# Patient Record
Sex: Female | Born: 1955
Health system: Southern US, Community
[De-identification: ages and names within clinical notes are randomized; demographics above are authoritative.]

## PROBLEM LIST (undated history)

## (undated) DIAGNOSIS — E7800 Pure hypercholesterolemia, unspecified: Secondary | ICD-10-CM

## (undated) HISTORY — DX: Pure hypercholesterolemia, unspecified: E78.00

## (undated) HISTORY — PX: CARPAL TUNNEL RELEASE: SHX101

## (undated) HISTORY — PX: BREAST BIOPSY: SHX20

---

## 2000-05-20 ENCOUNTER — Encounter: Payer: Self-pay | Admitting: *Deleted

## 2000-05-20 ENCOUNTER — Ambulatory Visit (HOSPITAL_COMMUNITY): Admission: RE | Admit: 2000-05-20 | Discharge: 2000-05-20 | Payer: Self-pay | Admitting: *Deleted

## 2002-07-20 ENCOUNTER — Ambulatory Visit (HOSPITAL_COMMUNITY): Admission: RE | Admit: 2002-07-20 | Discharge: 2002-07-20 | Payer: Self-pay | Admitting: *Deleted

## 2002-07-20 ENCOUNTER — Encounter: Payer: Self-pay | Admitting: *Deleted

## 2002-07-23 ENCOUNTER — Encounter: Admission: RE | Admit: 2002-07-23 | Discharge: 2002-07-23 | Payer: Self-pay | Admitting: Family Medicine

## 2002-07-23 ENCOUNTER — Encounter: Payer: Self-pay | Admitting: Family Medicine

## 2002-07-23 ENCOUNTER — Other Ambulatory Visit: Admission: RE | Admit: 2002-07-23 | Discharge: 2002-07-23 | Payer: Self-pay | Admitting: *Deleted

## 2003-08-13 ENCOUNTER — Other Ambulatory Visit: Admission: RE | Admit: 2003-08-13 | Discharge: 2003-08-13 | Payer: Self-pay | Admitting: *Deleted

## 2004-02-28 ENCOUNTER — Ambulatory Visit (HOSPITAL_COMMUNITY): Admission: RE | Admit: 2004-02-28 | Discharge: 2004-02-28 | Payer: Self-pay | Admitting: Gastroenterology

## 2005-11-15 ENCOUNTER — Ambulatory Visit (HOSPITAL_COMMUNITY): Admission: RE | Admit: 2005-11-15 | Discharge: 2005-11-15 | Payer: Self-pay | Admitting: *Deleted

## 2005-11-23 ENCOUNTER — Other Ambulatory Visit: Admission: RE | Admit: 2005-11-23 | Discharge: 2005-11-23 | Payer: Self-pay | Admitting: *Deleted

## 2007-06-09 ENCOUNTER — Ambulatory Visit (HOSPITAL_COMMUNITY): Admission: RE | Admit: 2007-06-09 | Discharge: 2007-06-09 | Payer: Self-pay | Admitting: *Deleted

## 2007-07-05 ENCOUNTER — Other Ambulatory Visit: Admission: RE | Admit: 2007-07-05 | Discharge: 2007-07-05 | Payer: Self-pay | Admitting: *Deleted

## 2008-11-08 ENCOUNTER — Ambulatory Visit: Payer: Self-pay | Admitting: Obstetrics and Gynecology

## 2008-11-18 ENCOUNTER — Ambulatory Visit: Payer: Self-pay | Admitting: Obstetrics and Gynecology

## 2008-11-18 ENCOUNTER — Encounter: Payer: Self-pay | Admitting: Obstetrics and Gynecology

## 2008-11-18 ENCOUNTER — Other Ambulatory Visit: Admission: RE | Admit: 2008-11-18 | Discharge: 2008-11-18 | Payer: Self-pay | Admitting: Obstetrics and Gynecology

## 2008-11-29 ENCOUNTER — Ambulatory Visit: Payer: Self-pay | Admitting: Obstetrics and Gynecology

## 2008-12-16 ENCOUNTER — Ambulatory Visit: Payer: Self-pay | Admitting: Obstetrics and Gynecology

## 2008-12-18 ENCOUNTER — Encounter: Payer: Self-pay | Admitting: Obstetrics and Gynecology

## 2008-12-18 ENCOUNTER — Ambulatory Visit: Payer: Self-pay | Admitting: Obstetrics and Gynecology

## 2008-12-18 ENCOUNTER — Ambulatory Visit (HOSPITAL_BASED_OUTPATIENT_CLINIC_OR_DEPARTMENT_OTHER): Admission: RE | Admit: 2008-12-18 | Discharge: 2008-12-18 | Payer: Self-pay | Admitting: Obstetrics and Gynecology

## 2009-01-01 ENCOUNTER — Ambulatory Visit: Payer: Self-pay | Admitting: Obstetrics and Gynecology

## 2009-09-15 ENCOUNTER — Ambulatory Visit (HOSPITAL_COMMUNITY): Admission: RE | Admit: 2009-09-15 | Discharge: 2009-09-15 | Payer: Self-pay | Admitting: Family Medicine

## 2010-09-15 NOTE — Op Note (Signed)
Carol Christensen, Carol Christensen                ACCOUNT NO.:  [id number]   MEDICAL RECORD NO.:  [id number]          PATIENT TYPE:  AMB   LOCATION:  [facility]                         FACILITY:  [facility]   PHYSICIAN:  Daniel L. Gottsegen, M.D.DATE OF BIRTH:  [birth year]   DATE OF PROCEDURE:  12/18/2008  DATE OF DISCHARGE:                               OPERATIVE REPORT   PREOPERATIVE DIAGNOSIS:  1. Menorrhagia.  2. Endometrial polyp.  3. Leiomyomata uteri.   POSTOPERATIVE DIAGNOSIS:  1. Menorrhagia.  2. Endometrial polyp.  3. Leiomyomata uteri.   OPERATIONS:  Hysteroscopy with excision of endometrial polyp, dilatation  and curettage.   SURGEON:  Dr. Edyth Gunnels   ANESTHESIA:  General.   INDICATIONS:  The patient is a 55 year old gravida 4, para 3, AB 1, who  has continued to have regular periods until recently when she had severe  menometrorrhagia.  It was stopped with Megace.  An endometrial biopsy  was benign.  Saline infusion hysterogram showed multiple myomas, as well  as a large endometrial polyp.  She now enters the hospital for D and C,  hysteroscopy.   FINDINGS:  External is normal.  BUS is normal.  Vaginal is normal.  Cervix is clean.  Uterus is enlarged by myomas, including one coming off  the left adnexal area, for a total size of the fundus of about 10+  weeks.  Adnexa failed to reveal masses, other than the myoma that can be  found on the left side.  At the time of hysteroscopy, the patient had a  very large endometrial polyp off the posterior wall of about 2 cm.  The  intrauterine cavity was enlarged, probably from her myomas, although we  did not sound her, because we did not want to risk perforation.  I  estimated that the cavity length was 10-11 cm.   PROCEDURE:  After adequate general endotracheal anesthesia, the patient  was placed in dorsal lithotomy position, prepped and draped in the usual  sterile manner.  A single-tooth tenaculum was placed on the anterior lip  of  the cervix.  The cervix could be dilated to a #31 Pratt dilator,  although she had a very tight cervix.  A hysteroscopic resectoscope was  then introduced; 3% sorbitol was used to expand the intrauterine cavity.  A camera was used for magnification.  The large polyp was immediately  encountered.  A picture was taken of it and then it was excised with a  90-degree wire loop with appropriate Bovie settings.  An endometrial  curettage was done with a large amount of tissue removed, due to the  Megace she was on.  She was then re-hysteroscoped and she had a very  normal  endometrial cavity at this point.  Therefore, the procedure was  terminated.  Fluid deficit was 60 mL.  She had sustained a very small  laceration on the anterior lip of the cervix from the dilatation and  this was repaired with a figure-of-eight of 2-0 Vicryl.  Blood loss was  minimal.  The patient left the operating room in satisfactory condition.  Daniel L. Eda Paschal, M.D.  Electronically Signed     DLG/MEDQ  D:  12/18/2008  T:  12/18/2008  Job:  [id number]

## 2010-09-18 NOTE — Op Note (Signed)
Carol Christensen, Carol Christensen                ACCOUNT NO.:  [id number]   MEDICAL RECORD NO.:  [id number]          PATIENT TYPE:  AMB   LOCATION:  ENDO                         FACILITY:  [facility]   PHYSICIAN:  James L. Malon Kindle., M.D.DATE OF BIRTH:  06-Feb-1956   DATE OF PROCEDURE:  DATE OF DISCHARGE:                                 OPERATIVE REPORT   PROCEDURE:  Colonoscopy.   SURGEON:   MEDICATIONS:  Fentanyl 80 mcg and Versed 8 mg IV.   SCOPE:  Olympus pediatric colonoscope.   INDICATIONS FOR PROCEDURE:  Colon cancer screening.   DESCRIPTION OF PROCEDURE:  The procedure had been explained to the patient  and consent obtained.  With the patient in the left lateral decubitus  position, the Olympus pediatric adjustable scope was inserted and advanced.  The prep was excellent.  We were able to reach the cecum with minimal  difficulty.  The ileocecal valve and appendiceal orifice were seen.  The  scope was withdrawn.  The cecum, ascending colon, transverse colon,  descending and sigmoid colon were seen well and no polyps were seen.  There  were scattered diverticula in the sigmoid colon.  The rectum was free of  polyps.  The scope was withdrawn.  The patient tolerated the procedure well.   ASSESSMENT:  Normal screening colonoscopy, V76.51.   PLAN:  Would recommend yearly Hemoccults and repeat colonoscopy in the  future for specific indications.       JLE/MEDQ  D:  02/28/2004  T:  02/28/2004  Job:  [id number]   cc:   Bryan Lemma. Manus Gunning, M.D.  [address] [place]  [place] [address]  Fax: [phone]

## 2012-03-21 ENCOUNTER — Other Ambulatory Visit (HOSPITAL_COMMUNITY): Payer: Self-pay | Admitting: Family Medicine

## 2012-03-21 DIAGNOSIS — Z1231 Encounter for screening mammogram for malignant neoplasm of breast: Secondary | ICD-10-CM

## 2012-04-11 ENCOUNTER — Ambulatory Visit (HOSPITAL_COMMUNITY)
Admission: RE | Admit: 2012-04-11 | Discharge: 2012-04-11 | Disposition: A | Discharge status: Home / Self Care | Payer: 59 | Source: Ambulatory Visit | Attending: Family Medicine | Admitting: Family Medicine

## 2012-04-11 DIAGNOSIS — Z1231 Encounter for screening mammogram for malignant neoplasm of breast: Secondary | ICD-10-CM | POA: Insufficient documentation

## 2012-04-14 ENCOUNTER — Other Ambulatory Visit: Payer: Self-pay | Admitting: Family Medicine

## 2012-04-14 DIAGNOSIS — R928 Other abnormal and inconclusive findings on diagnostic imaging of breast: Secondary | ICD-10-CM

## 2012-04-17 ENCOUNTER — Other Ambulatory Visit: Payer: Self-pay | Admitting: Family Medicine

## 2012-04-17 ENCOUNTER — Ambulatory Visit
Admission: RE | Admit: 2012-04-17 | Discharge: 2012-04-17 | Disposition: A | Discharge status: Home / Self Care | Payer: 59 | Source: Ambulatory Visit | Attending: Family Medicine | Admitting: Family Medicine

## 2012-04-17 DIAGNOSIS — R928 Other abnormal and inconclusive findings on diagnostic imaging of breast: Secondary | ICD-10-CM

## 2012-05-05 ENCOUNTER — Other Ambulatory Visit: Payer: Self-pay | Admitting: Family Medicine

## 2012-05-05 ENCOUNTER — Ambulatory Visit
Admission: RE | Admit: 2012-05-05 | Discharge: 2012-05-05 | Disposition: A | Discharge status: Home / Self Care | Payer: 59 | Source: Ambulatory Visit | Attending: Family Medicine | Admitting: Family Medicine

## 2012-05-05 ENCOUNTER — Ambulatory Visit
Admission: RE | Admit: 2012-05-05 | Discharge: 2012-05-05 | Disposition: A | Discharge status: Home / Self Care | Payer: 59 | Source: Ambulatory Visit

## 2012-05-05 DIAGNOSIS — R928 Other abnormal and inconclusive findings on diagnostic imaging of breast: Secondary | ICD-10-CM

## 2012-05-05 DIAGNOSIS — N63 Unspecified lump in unspecified breast: Secondary | ICD-10-CM

## 2012-05-10 ENCOUNTER — Other Ambulatory Visit: Payer: Self-pay | Admitting: Family Medicine

## 2012-05-10 DIAGNOSIS — R928 Other abnormal and inconclusive findings on diagnostic imaging of breast: Secondary | ICD-10-CM

## 2013-04-13 ENCOUNTER — Other Ambulatory Visit: Payer: Self-pay | Admitting: Family Medicine

## 2013-04-13 DIAGNOSIS — N6082 Other benign mammary dysplasias of left breast: Secondary | ICD-10-CM

## 2013-04-19 ENCOUNTER — Other Ambulatory Visit (HOSPITAL_COMMUNITY)
Admission: RE | Admit: 2013-04-19 | Discharge: 2013-04-19 | Disposition: A | Discharge status: Home / Self Care | Payer: 59 | Source: Ambulatory Visit | Attending: Family Medicine | Admitting: Family Medicine

## 2013-04-19 ENCOUNTER — Other Ambulatory Visit: Payer: Self-pay | Admitting: Family Medicine

## 2013-04-19 DIAGNOSIS — Z124 Encounter for screening for malignant neoplasm of cervix: Secondary | ICD-10-CM | POA: Insufficient documentation

## 2013-05-04 ENCOUNTER — Ambulatory Visit
Admission: RE | Admit: 2013-05-04 | Discharge: 2013-05-04 | Disposition: A | Discharge status: Home / Self Care | Payer: 59 | Source: Ambulatory Visit | Attending: Family Medicine | Admitting: Family Medicine

## 2013-05-04 DIAGNOSIS — N6082 Other benign mammary dysplasias of left breast: Secondary | ICD-10-CM

## 2014-04-09 ENCOUNTER — Other Ambulatory Visit: Payer: Self-pay

## 2014-04-09 DIAGNOSIS — Z1231 Encounter for screening mammogram for malignant neoplasm of breast: Secondary | ICD-10-CM

## 2014-05-06 ENCOUNTER — Ambulatory Visit
Admission: RE | Admit: 2014-05-06 | Discharge: 2014-05-06 | Disposition: A | Discharge status: Home / Self Care | Payer: 59 | Source: Ambulatory Visit

## 2014-05-06 DIAGNOSIS — Z1231 Encounter for screening mammogram for malignant neoplasm of breast: Secondary | ICD-10-CM

## 2014-05-08 ENCOUNTER — Other Ambulatory Visit: Payer: Self-pay | Admitting: Family Medicine

## 2014-05-08 DIAGNOSIS — R928 Other abnormal and inconclusive findings on diagnostic imaging of breast: Secondary | ICD-10-CM

## 2014-05-21 ENCOUNTER — Other Ambulatory Visit: Payer: Self-pay | Admitting: Family Medicine

## 2014-05-23 ENCOUNTER — Ambulatory Visit
Admission: RE | Admit: 2014-05-23 | Discharge: 2014-05-23 | Disposition: A | Discharge status: Home / Self Care | Payer: 59 | Source: Ambulatory Visit | Attending: Family Medicine | Admitting: Family Medicine

## 2014-05-23 ENCOUNTER — Other Ambulatory Visit: Payer: Self-pay | Admitting: Family Medicine

## 2014-05-23 DIAGNOSIS — R921 Mammographic calcification found on diagnostic imaging of breast: Secondary | ICD-10-CM

## 2014-05-23 DIAGNOSIS — R928 Other abnormal and inconclusive findings on diagnostic imaging of breast: Secondary | ICD-10-CM

## 2014-05-28 ENCOUNTER — Other Ambulatory Visit: Payer: Self-pay | Admitting: Family Medicine

## 2014-05-28 DIAGNOSIS — R921 Mammographic calcification found on diagnostic imaging of breast: Secondary | ICD-10-CM

## 2014-06-10 ENCOUNTER — Ambulatory Visit
Admission: RE | Admit: 2014-06-10 | Discharge: 2014-06-10 | Disposition: A | Discharge status: Home / Self Care | Payer: 59 | Source: Ambulatory Visit | Attending: Family Medicine | Admitting: Family Medicine

## 2014-06-10 DIAGNOSIS — R921 Mammographic calcification found on diagnostic imaging of breast: Secondary | ICD-10-CM

## 2016-06-09 ENCOUNTER — Other Ambulatory Visit: Payer: Self-pay | Admitting: Family Medicine

## 2016-06-09 ENCOUNTER — Other Ambulatory Visit (HOSPITAL_COMMUNITY)
Admission: RE | Admit: 2016-06-09 | Discharge: 2016-06-09 | Disposition: A | Discharge status: Home / Self Care | Payer: 59 | Source: Ambulatory Visit | Attending: Family Medicine | Admitting: Family Medicine

## 2016-06-09 DIAGNOSIS — Z01419 Encounter for gynecological examination (general) (routine) without abnormal findings: Secondary | ICD-10-CM | POA: Insufficient documentation

## 2016-06-11 LAB — CYTOLOGY - PAP: Diagnosis: NEGATIVE

## 2016-10-15 IMAGING — MG MM SCREENING BREAST TOMO BILATERAL
6 of 10 series · 6 of 26 positions shown · non-contrast
Comparison: Previous Exam(s)

CLINICAL DATA: Screening.

EXAM:
DIGITAL SCREENING BILATERAL MAMMOGRAM WITH 3D TOMO WITH CAD

[L MLO (1 of 2)]
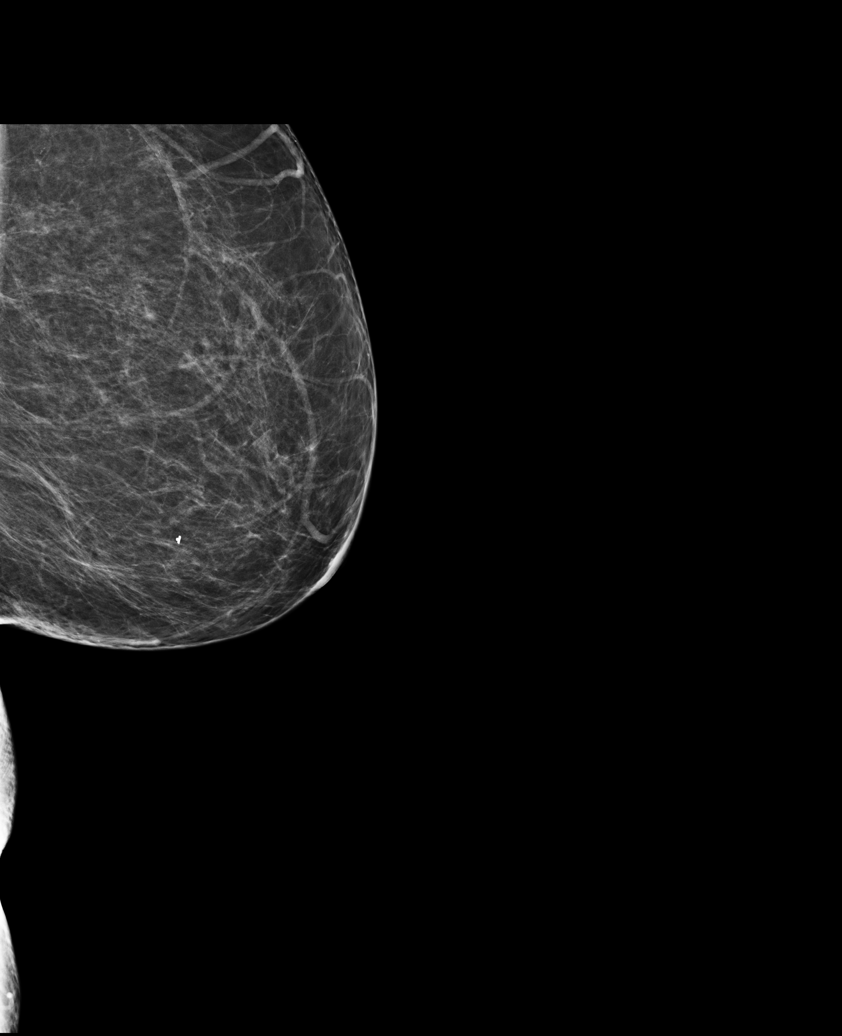

[R MLO (1 of 2)]
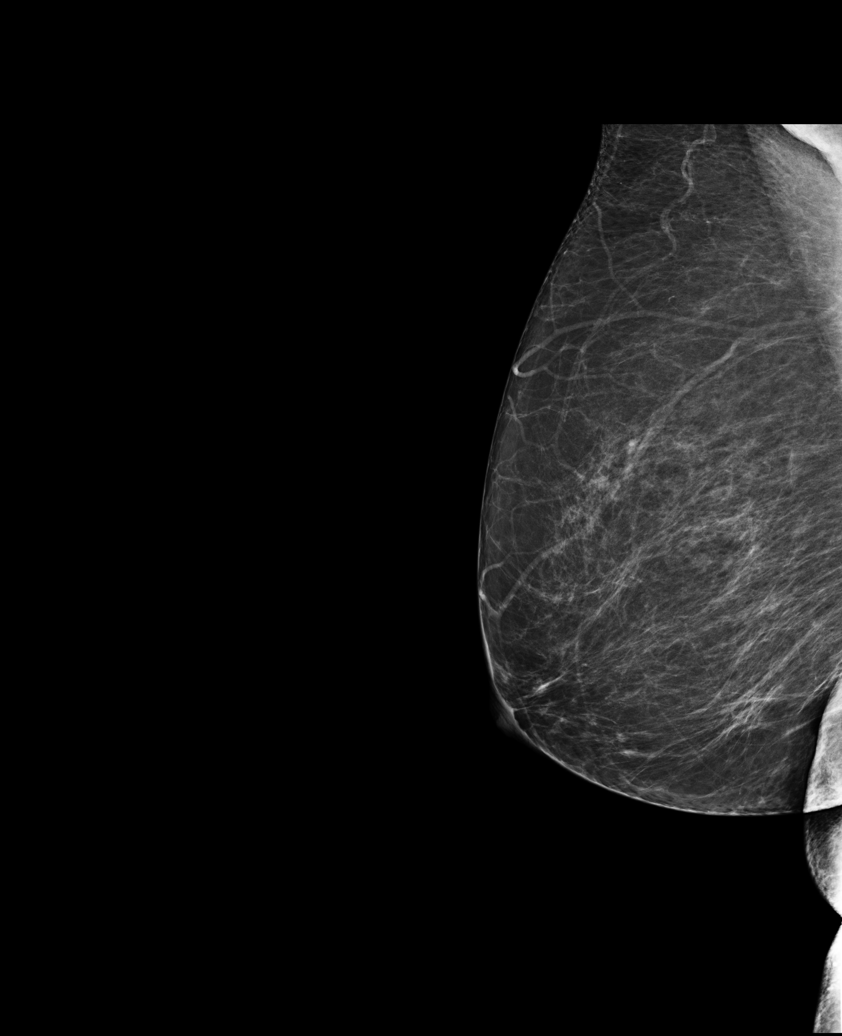

[R CC]
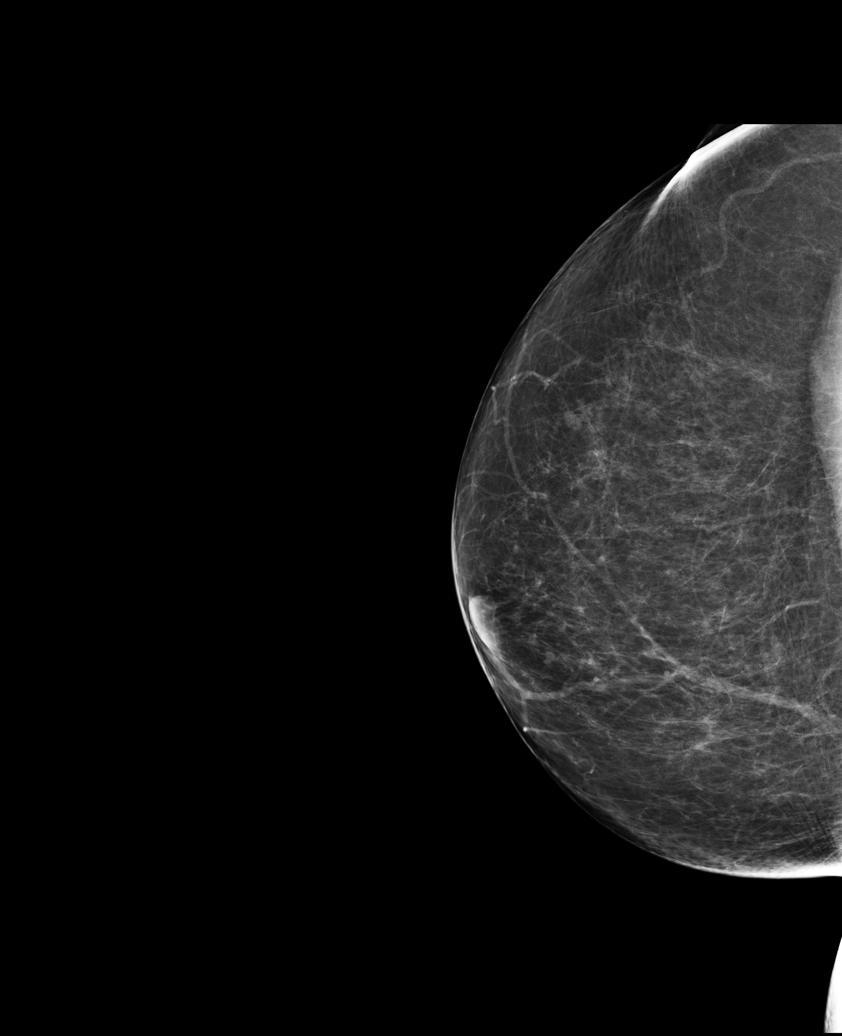

[L CC]
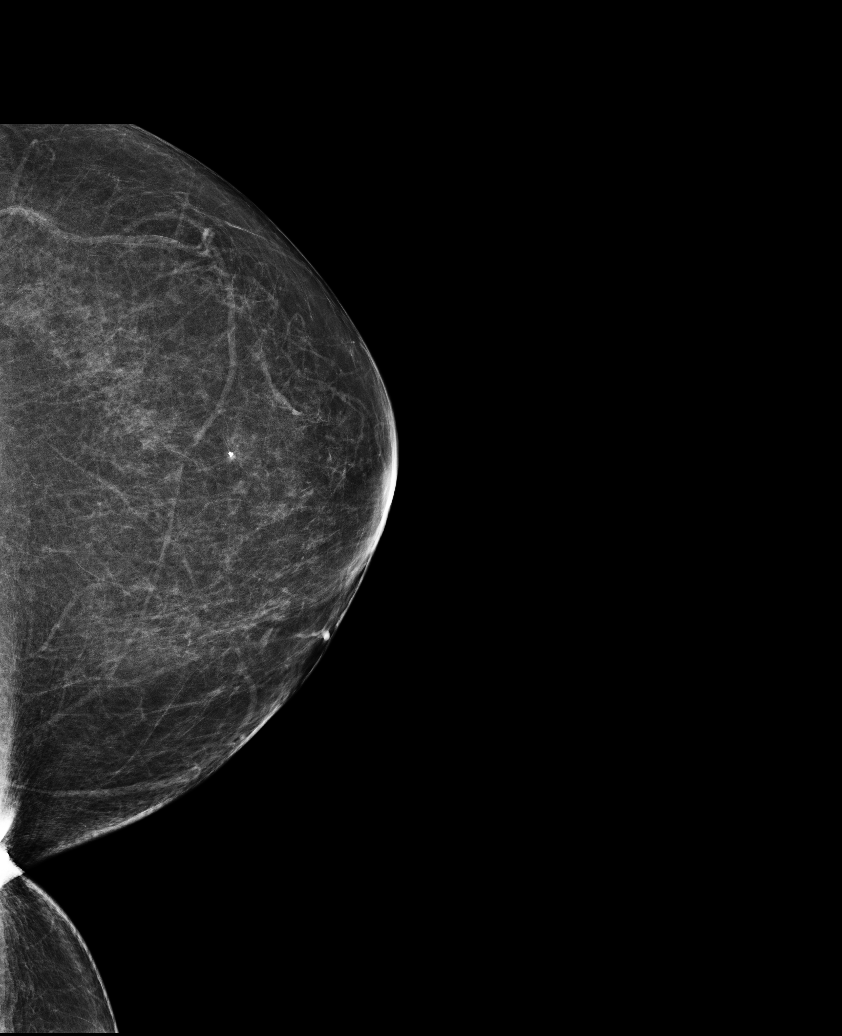

[L MLO (2 of 2)]
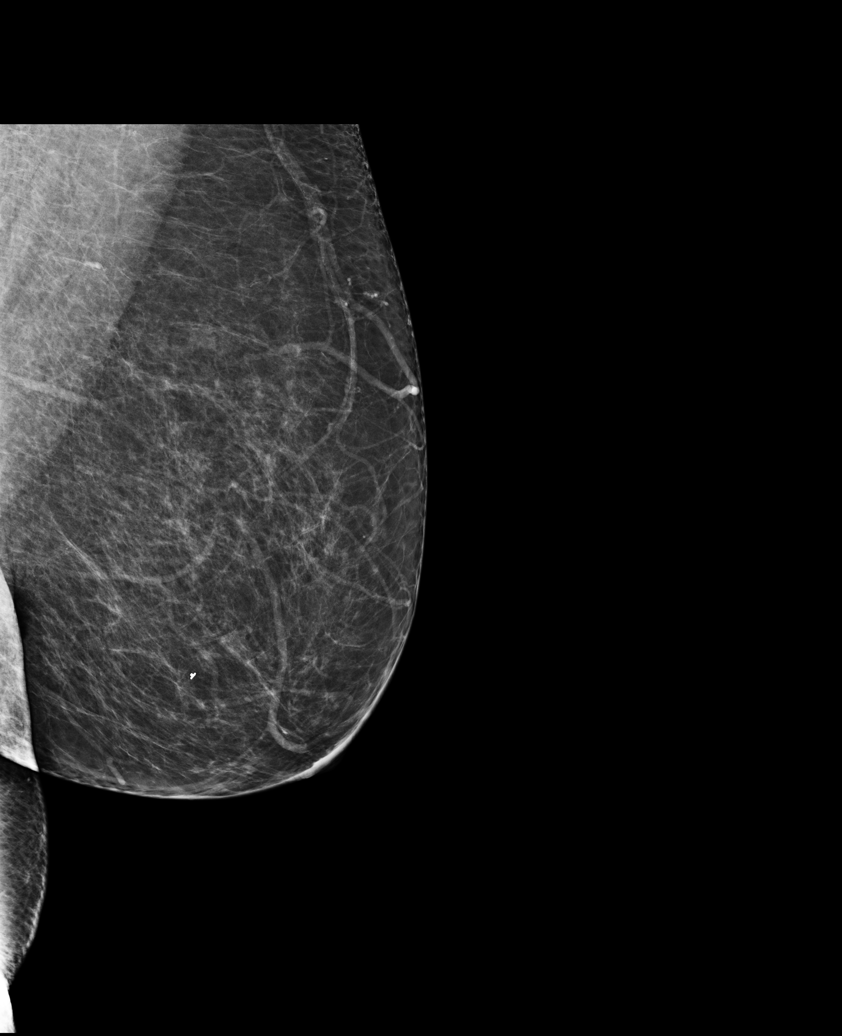

[R MLO (2 of 2)]
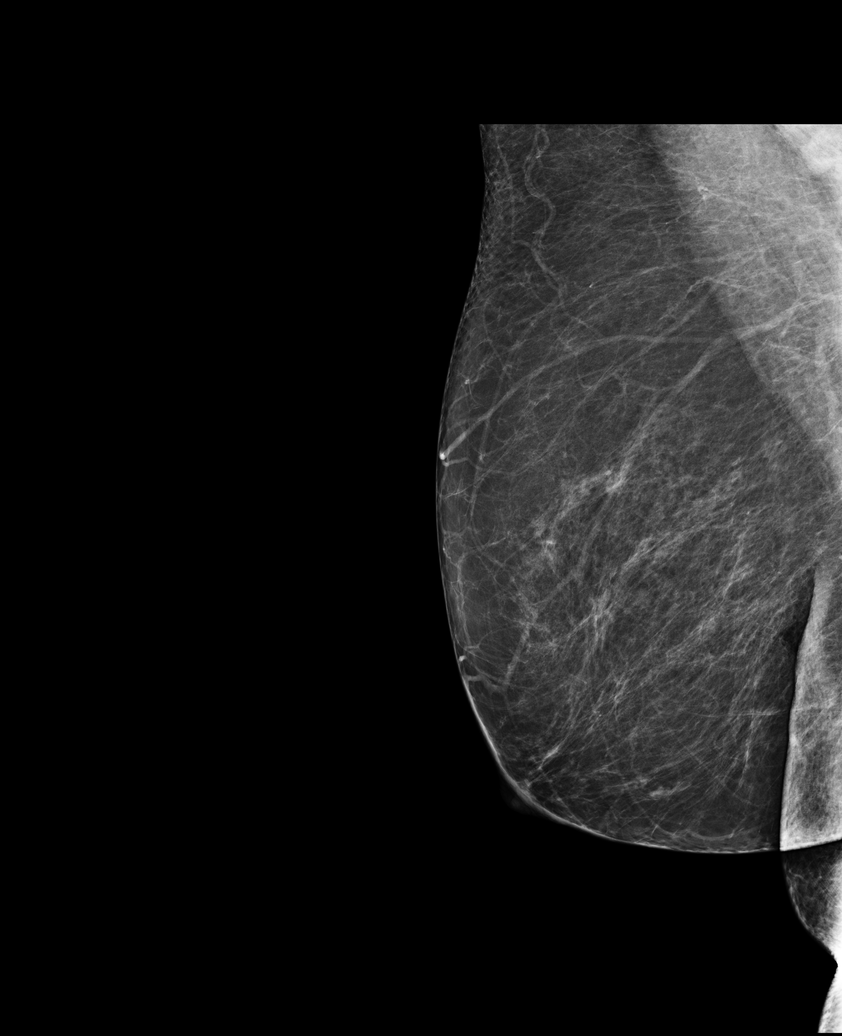

[6 of 26 positions shown; findings below may reference images not displayed]

ACR Breast Density Category b: There are scattered areas of
fibroglandular density.
FINDINGS: In the left breast, calcifications warrant further evaluation with
magnified views. In the right breast, no findings suspicious for
malignancy. Images were processed with CAD.
IMPRESSION: Further evaluation is suggested for calcifications in the left
breast.

RECOMMENDATION:
Diagnostic mammogram of the left breast. (Code:4F-5-LLD)

The patient will be contacted regarding the findings, and additional
imaging will be scheduled.

BI-RADS CATEGORY  0: Incomplete. Need additional imaging evaluation
and/or prior mammograms for comparison.

## 2016-10-28 ENCOUNTER — Other Ambulatory Visit: Payer: Self-pay | Admitting: Family Medicine

## 2016-10-28 DIAGNOSIS — Z1231 Encounter for screening mammogram for malignant neoplasm of breast: Secondary | ICD-10-CM

## 2016-11-02 ENCOUNTER — Ambulatory Visit
Admission: RE | Admit: 2016-11-02 | Discharge: 2016-11-02 | Disposition: A | Discharge status: Home / Self Care | Payer: 59 | Source: Ambulatory Visit | Attending: Family Medicine | Admitting: Family Medicine

## 2016-11-02 DIAGNOSIS — Z1231 Encounter for screening mammogram for malignant neoplasm of breast: Secondary | ICD-10-CM

## 2018-06-05 DIAGNOSIS — Z13220 Encounter for screening for lipoid disorders: Secondary | ICD-10-CM | POA: Diagnosis not present

## 2018-06-05 DIAGNOSIS — Z0000 Encounter for general adult medical examination without abnormal findings: Secondary | ICD-10-CM | POA: Diagnosis not present

## 2018-06-05 DIAGNOSIS — G250 Essential tremor: Secondary | ICD-10-CM | POA: Diagnosis not present

## 2018-09-04 DIAGNOSIS — E7800 Pure hypercholesterolemia, unspecified: Secondary | ICD-10-CM | POA: Diagnosis not present

## 2018-11-02 DIAGNOSIS — E7800 Pure hypercholesterolemia, unspecified: Secondary | ICD-10-CM | POA: Diagnosis not present

## 2018-11-02 DIAGNOSIS — Z79899 Other long term (current) drug therapy: Secondary | ICD-10-CM | POA: Diagnosis not present

## 2018-11-22 ENCOUNTER — Other Ambulatory Visit: Payer: Self-pay | Admitting: Family Medicine

## 2018-11-22 DIAGNOSIS — Z1231 Encounter for screening mammogram for malignant neoplasm of breast: Secondary | ICD-10-CM

## 2018-11-30 ENCOUNTER — Ambulatory Visit
Admission: RE | Admit: 2018-11-30 | Discharge: 2018-11-30 | Disposition: A | Discharge status: Home / Self Care | Payer: Medicare Other | Source: Ambulatory Visit | Attending: Family Medicine | Admitting: Family Medicine

## 2018-11-30 ENCOUNTER — Other Ambulatory Visit: Payer: Self-pay

## 2018-11-30 DIAGNOSIS — Z1231 Encounter for screening mammogram for malignant neoplasm of breast: Secondary | ICD-10-CM

## 2018-12-14 DIAGNOSIS — H2513 Age-related nuclear cataract, bilateral: Secondary | ICD-10-CM | POA: Diagnosis not present

## 2018-12-14 DIAGNOSIS — H04123 Dry eye syndrome of bilateral lacrimal glands: Secondary | ICD-10-CM | POA: Diagnosis not present

## 2018-12-14 DIAGNOSIS — H5203 Hypermetropia, bilateral: Secondary | ICD-10-CM | POA: Diagnosis not present

## 2018-12-14 DIAGNOSIS — H43811 Vitreous degeneration, right eye: Secondary | ICD-10-CM | POA: Diagnosis not present

## 2019-02-01 DIAGNOSIS — M25532 Pain in left wrist: Secondary | ICD-10-CM | POA: Diagnosis not present

## 2019-02-01 DIAGNOSIS — M654 Radial styloid tenosynovitis [de Quervain]: Secondary | ICD-10-CM | POA: Diagnosis not present

## 2019-03-08 DIAGNOSIS — M654 Radial styloid tenosynovitis [de Quervain]: Secondary | ICD-10-CM | POA: Diagnosis not present

## 2019-03-08 DIAGNOSIS — M25532 Pain in left wrist: Secondary | ICD-10-CM | POA: Diagnosis not present

## 2019-05-08 DIAGNOSIS — M654 Radial styloid tenosynovitis [de Quervain]: Secondary | ICD-10-CM | POA: Diagnosis not present

## 2019-05-08 DIAGNOSIS — M25532 Pain in left wrist: Secondary | ICD-10-CM | POA: Diagnosis not present

## 2019-05-18 ENCOUNTER — Other Ambulatory Visit: Payer: Self-pay

## 2019-05-18 DIAGNOSIS — E65 Localized adiposity: Secondary | ICD-10-CM | POA: Diagnosis not present

## 2019-05-18 DIAGNOSIS — M654 Radial styloid tenosynovitis [de Quervain]: Secondary | ICD-10-CM | POA: Diagnosis not present

## 2019-05-18 DIAGNOSIS — D2112 Benign neoplasm of connective and other soft tissue of left upper limb, including shoulder: Secondary | ICD-10-CM | POA: Diagnosis not present

## 2019-06-03 ENCOUNTER — Ambulatory Visit: Payer: Medicare Other

## 2019-06-07 DIAGNOSIS — Z0000 Encounter for general adult medical examination without abnormal findings: Secondary | ICD-10-CM | POA: Diagnosis not present

## 2019-06-07 DIAGNOSIS — Z1159 Encounter for screening for other viral diseases: Secondary | ICD-10-CM | POA: Diagnosis not present

## 2019-06-07 DIAGNOSIS — Z79899 Other long term (current) drug therapy: Secondary | ICD-10-CM | POA: Diagnosis not present

## 2019-06-07 DIAGNOSIS — E7800 Pure hypercholesterolemia, unspecified: Secondary | ICD-10-CM | POA: Diagnosis not present

## 2019-06-10 ENCOUNTER — Ambulatory Visit: Payer: Medicare Other

## 2019-06-24 ENCOUNTER — Ambulatory Visit: Payer: Medicare Other

## 2019-07-04 ENCOUNTER — Ambulatory Visit: Payer: Medicare Other | Attending: Internal Medicine

## 2019-07-04 DIAGNOSIS — Z23 Encounter for immunization: Secondary | ICD-10-CM | POA: Insufficient documentation

## 2019-07-04 NOTE — Progress Notes (Signed)
   Covid-19 Vaccination Clinic  Name:  AERILYNN GOIN    MRN: [id number] DOB: [birth year]  07/04/2019  Ms. Hillery was observed post Covid-19 immunization for 15 minutes without incident. She was provided with Vaccine Information Sheet and instruction to access the V-Safe system.   Ms. Fennell was instructed to call 911 with any severe reactions post vaccine: Marland Kitchen Difficulty breathing  . Swelling of face and throat  . A fast heartbeat  . A bad rash all over body  . Dizziness and weakness   Immunizations Administered    Name Date Dose VIS Date Route   Pfizer COVID-19 Vaccine 07/04/2019  9:04 AM 0.3 mL 04/13/2019 Intramuscular   Manufacturer: [employer], [employer]   Lot: [id number]   NDC: [address]-8

## 2019-07-08 DIAGNOSIS — L509 Urticaria, unspecified: Secondary | ICD-10-CM | POA: Diagnosis not present

## 2019-07-25 ENCOUNTER — Ambulatory Visit: Payer: Medicare Other | Attending: Internal Medicine

## 2019-07-25 DIAGNOSIS — Z23 Encounter for immunization: Secondary | ICD-10-CM

## 2019-07-25 NOTE — Progress Notes (Signed)
   Covid-19 Vaccination Clinic  Name:  Carol Christensen    MRN: [id number] DOB: 07/19/1955  07/25/2019  [name] was observed post Covid-19 immunization for 15 minutes without incident. She was provided with Vaccine Information Sheet and instruction to access the V-Safe system.   [name] was instructed to call 911 with any severe reactions post vaccine: Marland Kitchen Difficulty breathing  . Swelling of face and throat  . A fast heartbeat  . A bad rash all over body  . Dizziness and weakness   Immunizations Administered    Name Date Dose VIS Date Route   Pfizer COVID-19 Vaccine 07/25/2019  9:16 AM 0.3 mL 04/13/2019 Intramuscular   Manufacturer: [employer], [employer]   Lot: [id number]   NDC: [address]-6

## 2019-11-06 DIAGNOSIS — M654 Radial styloid tenosynovitis [de Quervain]: Secondary | ICD-10-CM | POA: Diagnosis not present

## 2019-11-06 DIAGNOSIS — M1811 Unilateral primary osteoarthritis of first carpometacarpal joint, right hand: Secondary | ICD-10-CM | POA: Diagnosis not present

## 2019-12-06 DIAGNOSIS — M654 Radial styloid tenosynovitis [de Quervain]: Secondary | ICD-10-CM | POA: Diagnosis not present

## 2019-12-31 ENCOUNTER — Other Ambulatory Visit: Payer: Self-pay | Admitting: Family Medicine

## 2019-12-31 DIAGNOSIS — Z1231 Encounter for screening mammogram for malignant neoplasm of breast: Secondary | ICD-10-CM

## 2020-01-02 ENCOUNTER — Ambulatory Visit
Admission: RE | Admit: 2020-01-02 | Discharge: 2020-01-02 | Disposition: A | Discharge status: Home / Self Care | Payer: Medicare Other | Source: Ambulatory Visit

## 2020-01-02 ENCOUNTER — Other Ambulatory Visit: Payer: Self-pay

## 2020-01-02 DIAGNOSIS — Z1231 Encounter for screening mammogram for malignant neoplasm of breast: Secondary | ICD-10-CM

## 2020-02-04 DIAGNOSIS — H43813 Vitreous degeneration, bilateral: Secondary | ICD-10-CM | POA: Diagnosis not present

## 2020-02-04 DIAGNOSIS — H2513 Age-related nuclear cataract, bilateral: Secondary | ICD-10-CM | POA: Diagnosis not present

## 2020-02-04 DIAGNOSIS — H04123 Dry eye syndrome of bilateral lacrimal glands: Secondary | ICD-10-CM | POA: Diagnosis not present

## 2020-02-15 DIAGNOSIS — L820 Inflamed seborrheic keratosis: Secondary | ICD-10-CM | POA: Diagnosis not present

## 2020-02-15 DIAGNOSIS — L814 Other melanin hyperpigmentation: Secondary | ICD-10-CM | POA: Diagnosis not present

## 2020-02-15 DIAGNOSIS — L570 Actinic keratosis: Secondary | ICD-10-CM | POA: Diagnosis not present

## 2020-02-15 DIAGNOSIS — D1801 Hemangioma of skin and subcutaneous tissue: Secondary | ICD-10-CM | POA: Diagnosis not present

## 2020-06-18 DIAGNOSIS — Z0000 Encounter for general adult medical examination without abnormal findings: Secondary | ICD-10-CM | POA: Diagnosis not present

## 2020-06-18 DIAGNOSIS — Z79899 Other long term (current) drug therapy: Secondary | ICD-10-CM | POA: Diagnosis not present

## 2020-06-18 DIAGNOSIS — E7800 Pure hypercholesterolemia, unspecified: Secondary | ICD-10-CM | POA: Diagnosis not present

## 2020-09-11 DIAGNOSIS — M654 Radial styloid tenosynovitis [de Quervain]: Secondary | ICD-10-CM | POA: Diagnosis not present

## 2020-11-19 ENCOUNTER — Other Ambulatory Visit: Payer: Self-pay | Admitting: Family Medicine

## 2020-11-19 DIAGNOSIS — Z1231 Encounter for screening mammogram for malignant neoplasm of breast: Secondary | ICD-10-CM

## 2021-01-06 ENCOUNTER — Ambulatory Visit
Admission: RE | Admit: 2021-01-06 | Discharge: 2021-01-06 | Disposition: A | Discharge status: Home / Self Care | Payer: Medicare Other | Source: Ambulatory Visit

## 2021-01-06 ENCOUNTER — Other Ambulatory Visit: Payer: Self-pay

## 2021-01-06 DIAGNOSIS — Z1231 Encounter for screening mammogram for malignant neoplasm of breast: Secondary | ICD-10-CM

## 2021-02-10 DIAGNOSIS — H524 Presbyopia: Secondary | ICD-10-CM | POA: Diagnosis not present

## 2021-02-10 DIAGNOSIS — H04123 Dry eye syndrome of bilateral lacrimal glands: Secondary | ICD-10-CM | POA: Diagnosis not present

## 2021-02-10 DIAGNOSIS — H43813 Vitreous degeneration, bilateral: Secondary | ICD-10-CM | POA: Diagnosis not present

## 2021-02-10 DIAGNOSIS — H2513 Age-related nuclear cataract, bilateral: Secondary | ICD-10-CM | POA: Diagnosis not present

## 2021-02-23 DIAGNOSIS — L821 Other seborrheic keratosis: Secondary | ICD-10-CM | POA: Diagnosis not present

## 2021-02-23 DIAGNOSIS — D1801 Hemangioma of skin and subcutaneous tissue: Secondary | ICD-10-CM | POA: Diagnosis not present

## 2021-02-23 DIAGNOSIS — Z08 Encounter for follow-up examination after completed treatment for malignant neoplasm: Secondary | ICD-10-CM | POA: Diagnosis not present

## 2021-02-23 DIAGNOSIS — L814 Other melanin hyperpigmentation: Secondary | ICD-10-CM | POA: Diagnosis not present

## 2021-06-19 DIAGNOSIS — E7800 Pure hypercholesterolemia, unspecified: Secondary | ICD-10-CM | POA: Diagnosis not present

## 2021-06-19 DIAGNOSIS — Z0000 Encounter for general adult medical examination without abnormal findings: Secondary | ICD-10-CM | POA: Diagnosis not present

## 2021-06-19 DIAGNOSIS — Z1389 Encounter for screening for other disorder: Secondary | ICD-10-CM | POA: Diagnosis not present

## 2021-06-19 DIAGNOSIS — Z79899 Other long term (current) drug therapy: Secondary | ICD-10-CM | POA: Diagnosis not present

## 2021-08-11 DIAGNOSIS — M25511 Pain in right shoulder: Secondary | ICD-10-CM | POA: Diagnosis not present

## 2021-09-25 DIAGNOSIS — D485 Neoplasm of uncertain behavior of skin: Secondary | ICD-10-CM | POA: Diagnosis not present

## 2021-09-25 DIAGNOSIS — C44622 Squamous cell carcinoma of skin of right upper limb, including shoulder: Secondary | ICD-10-CM | POA: Diagnosis not present

## 2021-09-25 DIAGNOSIS — L821 Other seborrheic keratosis: Secondary | ICD-10-CM | POA: Diagnosis not present

## 2021-11-18 DIAGNOSIS — L905 Scar conditions and fibrosis of skin: Secondary | ICD-10-CM | POA: Diagnosis not present

## 2021-11-18 DIAGNOSIS — D0461 Carcinoma in situ of skin of right upper limb, including shoulder: Secondary | ICD-10-CM | POA: Diagnosis not present

## 2022-01-05 ENCOUNTER — Other Ambulatory Visit: Payer: Self-pay | Admitting: Family Medicine

## 2022-01-05 DIAGNOSIS — Z1231 Encounter for screening mammogram for malignant neoplasm of breast: Secondary | ICD-10-CM

## 2022-01-22 ENCOUNTER — Ambulatory Visit
Admission: RE | Admit: 2022-01-22 | Discharge: 2022-01-22 | Disposition: A | Discharge status: Home / Self Care | Payer: Medicare Other | Source: Ambulatory Visit | Attending: Family Medicine | Admitting: Family Medicine

## 2022-01-22 DIAGNOSIS — Z1231 Encounter for screening mammogram for malignant neoplasm of breast: Secondary | ICD-10-CM | POA: Diagnosis not present

## 2022-02-19 DIAGNOSIS — H5203 Hypermetropia, bilateral: Secondary | ICD-10-CM | POA: Diagnosis not present

## 2022-02-19 DIAGNOSIS — H2513 Age-related nuclear cataract, bilateral: Secondary | ICD-10-CM | POA: Diagnosis not present

## 2022-02-19 DIAGNOSIS — H52223 Regular astigmatism, bilateral: Secondary | ICD-10-CM | POA: Diagnosis not present

## 2022-02-19 DIAGNOSIS — H43813 Vitreous degeneration, bilateral: Secondary | ICD-10-CM | POA: Diagnosis not present

## 2022-02-22 DIAGNOSIS — Z08 Encounter for follow-up examination after completed treatment for malignant neoplasm: Secondary | ICD-10-CM | POA: Diagnosis not present

## 2022-02-22 DIAGNOSIS — L821 Other seborrheic keratosis: Secondary | ICD-10-CM | POA: Diagnosis not present

## 2022-02-22 DIAGNOSIS — D1801 Hemangioma of skin and subcutaneous tissue: Secondary | ICD-10-CM | POA: Diagnosis not present

## 2022-02-22 DIAGNOSIS — L814 Other melanin hyperpigmentation: Secondary | ICD-10-CM | POA: Diagnosis not present

## 2022-05-24 DIAGNOSIS — L570 Actinic keratosis: Secondary | ICD-10-CM | POA: Diagnosis not present

## 2022-06-03 DIAGNOSIS — C44529 Squamous cell carcinoma of skin of other part of trunk: Secondary | ICD-10-CM | POA: Diagnosis not present

## 2022-06-03 DIAGNOSIS — D485 Neoplasm of uncertain behavior of skin: Secondary | ICD-10-CM | POA: Diagnosis not present

## 2022-06-22 DIAGNOSIS — Z0000 Encounter for general adult medical examination without abnormal findings: Secondary | ICD-10-CM | POA: Diagnosis not present

## 2022-06-22 DIAGNOSIS — C44529 Squamous cell carcinoma of skin of other part of trunk: Secondary | ICD-10-CM | POA: Diagnosis not present

## 2022-06-22 DIAGNOSIS — Z23 Encounter for immunization: Secondary | ICD-10-CM | POA: Diagnosis not present

## 2022-06-22 DIAGNOSIS — E7800 Pure hypercholesterolemia, unspecified: Secondary | ICD-10-CM | POA: Diagnosis not present

## 2022-06-22 DIAGNOSIS — G250 Essential tremor: Secondary | ICD-10-CM | POA: Diagnosis not present

## 2022-06-22 DIAGNOSIS — Z79899 Other long term (current) drug therapy: Secondary | ICD-10-CM | POA: Diagnosis not present

## 2022-06-22 DIAGNOSIS — D692 Other nonthrombocytopenic purpura: Secondary | ICD-10-CM | POA: Diagnosis not present

## 2022-06-22 DIAGNOSIS — Z1331 Encounter for screening for depression: Secondary | ICD-10-CM | POA: Diagnosis not present

## 2022-07-14 DIAGNOSIS — L905 Scar conditions and fibrosis of skin: Secondary | ICD-10-CM | POA: Diagnosis not present

## 2022-07-14 DIAGNOSIS — D485 Neoplasm of uncertain behavior of skin: Secondary | ICD-10-CM | POA: Diagnosis not present

## 2022-07-14 DIAGNOSIS — C44529 Squamous cell carcinoma of skin of other part of trunk: Secondary | ICD-10-CM | POA: Diagnosis not present

## 2022-08-04 DIAGNOSIS — R059 Cough, unspecified: Secondary | ICD-10-CM | POA: Diagnosis not present

## 2022-08-04 DIAGNOSIS — J9801 Acute bronchospasm: Secondary | ICD-10-CM | POA: Diagnosis not present

## 2022-09-17 ENCOUNTER — Ambulatory Visit
Admission: RE | Admit: 2022-09-17 | Discharge: 2022-09-17 | Disposition: A | Discharge status: Home / Self Care | Payer: Medicare Other | Source: Ambulatory Visit | Attending: Family Medicine | Admitting: Family Medicine

## 2022-09-17 ENCOUNTER — Other Ambulatory Visit: Payer: Self-pay | Admitting: Family Medicine

## 2022-09-17 DIAGNOSIS — R509 Fever, unspecified: Secondary | ICD-10-CM | POA: Diagnosis not present

## 2022-09-17 DIAGNOSIS — R062 Wheezing: Secondary | ICD-10-CM

## 2022-09-17 DIAGNOSIS — R0981 Nasal congestion: Secondary | ICD-10-CM | POA: Diagnosis not present

## 2022-09-17 DIAGNOSIS — R059 Cough, unspecified: Secondary | ICD-10-CM | POA: Diagnosis not present

## 2022-11-30 DIAGNOSIS — B349 Viral infection, unspecified: Secondary | ICD-10-CM | POA: Diagnosis not present

## 2022-11-30 DIAGNOSIS — R490 Dysphonia: Secondary | ICD-10-CM | POA: Diagnosis not present

## 2022-12-23 DIAGNOSIS — L814 Other melanin hyperpigmentation: Secondary | ICD-10-CM | POA: Diagnosis not present

## 2022-12-23 DIAGNOSIS — L570 Actinic keratosis: Secondary | ICD-10-CM | POA: Diagnosis not present

## 2022-12-23 DIAGNOSIS — L821 Other seborrheic keratosis: Secondary | ICD-10-CM | POA: Diagnosis not present

## 2022-12-23 DIAGNOSIS — L578 Other skin changes due to chronic exposure to nonionizing radiation: Secondary | ICD-10-CM | POA: Diagnosis not present

## 2022-12-23 DIAGNOSIS — C44319 Basal cell carcinoma of skin of other parts of face: Secondary | ICD-10-CM | POA: Diagnosis not present

## 2022-12-23 DIAGNOSIS — D1801 Hemangioma of skin and subcutaneous tissue: Secondary | ICD-10-CM | POA: Diagnosis not present

## 2022-12-23 DIAGNOSIS — D485 Neoplasm of uncertain behavior of skin: Secondary | ICD-10-CM | POA: Diagnosis not present

## 2022-12-23 DIAGNOSIS — C44529 Squamous cell carcinoma of skin of other part of trunk: Secondary | ICD-10-CM | POA: Diagnosis not present

## 2023-01-10 DIAGNOSIS — C44529 Squamous cell carcinoma of skin of other part of trunk: Secondary | ICD-10-CM | POA: Diagnosis not present

## 2023-01-10 DIAGNOSIS — L905 Scar conditions and fibrosis of skin: Secondary | ICD-10-CM | POA: Diagnosis not present

## 2023-01-17 ENCOUNTER — Ambulatory Visit (INDEPENDENT_AMBULATORY_CARE_PROVIDER_SITE_OTHER): Payer: Medicare Other | Admitting: Otolaryngology

## 2023-01-17 ENCOUNTER — Encounter (INDEPENDENT_AMBULATORY_CARE_PROVIDER_SITE_OTHER): Payer: Self-pay | Admitting: Otolaryngology

## 2023-01-17 VITALS — BP 129/52 | HR 84 | Ht 64.0 in | Wt 154.6 lb

## 2023-01-17 DIAGNOSIS — R490 Dysphonia: Secondary | ICD-10-CM | POA: Diagnosis not present

## 2023-01-17 DIAGNOSIS — R0982 Postnasal drip: Secondary | ICD-10-CM | POA: Diagnosis not present

## 2023-01-17 DIAGNOSIS — J383 Other diseases of vocal cords: Secondary | ICD-10-CM

## 2023-01-17 DIAGNOSIS — R0981 Nasal congestion: Secondary | ICD-10-CM | POA: Diagnosis not present

## 2023-01-17 DIAGNOSIS — K219 Gastro-esophageal reflux disease without esophagitis: Secondary | ICD-10-CM | POA: Diagnosis not present

## 2023-01-17 DIAGNOSIS — J3089 Other allergic rhinitis: Secondary | ICD-10-CM

## 2023-01-17 DIAGNOSIS — J329 Chronic sinusitis, unspecified: Secondary | ICD-10-CM | POA: Diagnosis not present

## 2023-01-17 MED ORDER — AZELASTINE HCL 0.1 % NA SOLN: 2.0000 | Freq: Two times a day (BID) | NASAL | 12 refills | Status: AC

## 2023-01-17 MED ORDER — METHYLPREDNISOLONE 4 MG PO TBPK: ORAL_TABLET | ORAL | 1 refills | Status: AC

## 2023-01-17 MED ORDER — DOXYCYCLINE HYCLATE 100 MG PO TABS
100.0000 mg | ORAL_TABLET | Freq: Two times a day (BID) | ORAL | 0 refills | Status: AC
Start: 2023-01-17 — End: ?

## 2023-01-17 NOTE — Progress Notes (Signed)
ENT CONSULT:  Reason for Consult: dysphonia raspy voice and chronic nasal congestion x 10 weeks  HPI: Carol Christensen is an 67 y.o. female with prior hx of hives/urticaria, here for evaluation of chronic persistent nasal congestion and post-nasal drainage as well as raspy gravely voice.  Here for raspy voice, which persisted after she developed sore throat nasal congestion and postnasal drainage back in May.  At that time she was treated with antibiotic doxycycline and albuterol due to presence of productive cough and wheezing on exam, chest x-ray was negative.  She also contacted her old ENT who is now retired and was instructed to take reflux medications and Mucinex, she felt better initially but then developed recurrent nasal congestion postnasal drainage and raspy voice.  She sings in the choir, and noticed that since her symptoms started she is unable to sing Soprano anymore. She used to be on Astepro, not any more. Denies pain with talking or singing. Sore throat resolved completely. She has issues swallowing bread, but otherwise no dysphagia.  Denies dyspnea.  Records Reviewed:  Office note by [facility], 09/17/22 Started with some drainage Wedensday morning.   Exposed to lost of dust later that day.   Wednesday night coughing more.  THursday continue with hacking cough.   This morning continued to cough and had temp to 100.1.    Still with congestion in the throat, cough that is somewhat productive.  Some wheezing that she has noted.  Already on asterpro and claritin.  No sick contacts.   Given Rx for doxy and Albuterol - ordered CXR  Pseudoephedrine 12 hour- take twice a day for the next 5 days.   Flonase 2 sprays per nostril daily  Continue to use ibuprofen/Tylenol to help with headache and body aches and sore throat.   Gargles to help throat.   Honey at night to coat the throat.   Stay well hydrated and try to get some rest.   Delsym to help with cough.  Since you will be  gone to the beach next week lets give you some abx to take if none of the above seem to help and symptoms last until next wednesday.     Past Medical History:  Diagnosis Date   High cholesterol     Past Surgical History:  Procedure Laterality Date   BREAST BIOPSY     CARPAL TUNNEL RELEASE      Family History  Problem Relation Age of Onset   Brain cancer Mother    Hypertension Brother    Breast cancer Maternal Aunt    Breast cancer Cousin     Social History:  reports that she has never smoked. She has never been exposed to tobacco smoke. She has never used smokeless tobacco. She reports that she does not currently use alcohol after a past usage of about 1.0 standard drink of alcohol per week. She reports that she does not use drugs.  Allergies:  Allergies  Allergen Reactions   Penicillins Dermatitis    Other Reaction(s): rash- childhood allergy  Other reaction(s): childhood allergy   Sulfa Antibiotics Other (See Comments)    CHILDHOOD ALLERGY    Medications: I have reviewed the patient's current medications.  The PMH, PSH, Medications, Allergies, and SH were reviewed and updated.  ROS: Constitutional: Negative for fever, weight loss and weight gain. Cardiovascular: Negative for chest pain and dyspnea on exertion. Respiratory: Is not experiencing shortness of breath at rest. Gastrointestinal: Negative for nausea and vomiting. Neurological: Negative for  headaches. Psychiatric: The patient is not nervous/anxious  Blood pressure (!) 129/52, pulse 84, height 5\' 4"  (1.626 m), weight 154 lb 9.6 oz (70.1 kg), SpO2 97%.  PHYSICAL EXAM:  Exam: General: Well-developed, well-nourished Communication and Voice: Raspy Respiratory Respiratory effort: Equal inspiration and expiration without stridor Cardiovascular Peripheral Vascular: Warm extremities with equal color/perfusion Eyes: No nystagmus with equal extraocular motion bilaterally Neuro/Psych/Balance: Patient oriented  to person, place, and time; Appropriate mood and affect; Gait is intact with no imbalance; Cranial nerves I-XII are intact Head and Face Inspection: Normocephalic and atraumatic without mass or lesion Palpation: Facial skeleton intact without bony stepoffs Salivary Glands: No mass or tenderness Facial Strength: Facial motility symmetric and full bilaterally ENT Pinna: External ear intact and fully developed External canal: Canal is patent with intact skin Tympanic Membrane: Clear and mobile External Nose: No scar or anatomic deformity Internal Nose: Septum is deviated to the left. No polyp, or purulence. Mucosal edema and erythema present.  Bilateral inferior turbinate hypertrophy.  Lips, Teeth, and gums: Mucosa and teeth intact and viable TMJ: No pain to palpation with full mobility Oral cavity/oropharynx: No erythema or exudate, no lesions present Nasopharynx: No mass or lesion with intact mucosa Hypopharynx: Intact mucosa without pooling of secretions Larynx Glottic: Full true vocal cord mobility without lesion or mass Supraglottic: Normal appearing epiglottis and AE folds Interarytenoid Space: Moderate to severe pachydermia/edema Subglottic Space: Patent without lesion or edema Neck Neck and Trachea: Midline trachea without mass or lesion Thyroid: No mass or nodularity Lymphatics: No lymphadenopathy  Procedure 1:  PROCEDURE NOTE: nasal endoscopy  Preoperative diagnosis: chronic sinusitis symptoms  Postoperative diagnosis: same  Procedure: Diagnostic nasal endoscopy (91478)  Findings: Purulent drainage from left maxillary sinus os and along middle meatus on the left, also seen near choana and nasopharynx, mucosal edema, inferior turban hypertrophy  Surgeon: Ashok Croon, M.D.  Anesthesia: Topical lidocaine and Afrin  H&P REVIEW: The patient's history and physical were reviewed today prior to procedure. All medications were reviewed and updated as  well. Complications: None Condition is stable throughout exam Indications and consent: The patient presents with symptoms of chronic sinusitis not responding to previous therapies. All the risks, benefits, and potential complications were reviewed with the patient preoperatively and informed consent was obtained. The time out was completed with confirmation of the correct procedure.   Procedure: The patient was seated upright in the clinic. Topical lidocaine and Afrin were applied to the nasal cavity. After adequate anesthesia had occurred, the rigid nasal endoscope was passed into the nasal cavity. The nasal mucosa, turbinates, septum, and sinus drainage pathways were visualized bilaterally. This revealed purulence on the left side or significant secretions that might be cultured. There were no polyps or sites of significant inflammation. The mucosa was intact and there was no crusting present. The scope was then slowly withdrawn and the patient tolerated the procedure well. There were no complications or blood loss.  Procedure 2: Summary of Video-Laryngeal-Stroboscopy: No masses or lesions, vocal fold atrophy, spindle-shaped glottic gap with phonation, and supraglottic compression, no masses or lesions, bilateral mucosal wave is symmetric with normal amplitude  Preoperative diagnosis: hoarseness  Postoperative diagnosis:   same  Procedure: Flexible fiberoptic laryngoscopy with stroboscopy (29562)  Surgeon: Ashok Croon, MD  Anesthesia: Topical lidocaine and Afrin  Complications: None  Condition is stable throughout exam  Indications and consent:   The patient presents to the clinic with hoarseness. All the risks, benefits, and potential complications were reviewed with the patient preoperatively and  informed verbal consent was obtained.  Procedure: The patient was seated upright in the exam chair.   Topical lidocaine and Afrin were applied to the nasal cavity. After adequate anesthesia  had occurred, the flexible telescope was passed into the nasal cavity. The nasopharynx was patent without mass or lesion. The scope was passed behind the soft palate and directed toward the base of tongue. The base of tongue was visualized and was symmetric with no apparent masses or abnormal appearing tissue. There were no signs of a mass or pooling of secretions in the piriform sinuses. The supraglottic structures were normal.  The true vocal cords are mobile. The medial edges were bowed. Closure was incomplete. Periodicity present. The mucosal wave and amplitude were normal. There is moderate to severe interarytenoid pachydermia and post cricoid edema. The mucosa appears healthy.   The laryngoscope was then slowly withdrawn and the patient tolerated the procedure well. There were no complications or blood loss.  Studies Reviewed: CXR 11/17/22  CLINICAL DATA:  Wheezing, congestion, productive cough and fever   EXAM: CHEST - 2 VIEW   COMPARISON:  None Available.   FINDINGS: The heart size and mediastinal contours are within normal limits. Both lungs are clear. The visualized skeletal structures are unremarkable.   IMPRESSION: No active cardiopulmonary disease.  Assessment/Plan: Encounter Diagnoses  Name Primary?   Chronic sinusitis, unspecified location Yes   Dysphonia    Vocal fold atrophy    Glottic insufficiency    Gastroesophageal reflux disease without esophagitis    Post-nasal drainage    Nasal congestion    Environmental and seasonal allergies    67 year old female history of urticaria and hives in the past, here for chronic nasal congestion dry cough raspy voice, who failed a round of antibiotic, and continues to have hoarseness and nasal congestion with postnasal drainage.  Previously tried Astepro without relief.  She had unremarkable chest x-ray back in May of this year.  Sings in the choir and noticed that she can no longer sing soprano.  She started to take Prilosec and  Tums, and also started Mucinex few weeks ago and states that her symptoms are starting to improve.  Nasal congestion and post-nasal drainage -exam/nasal endoscopy today with evidence of purulent drainage from left maxillary os suspect chronic sinusitis -also had evidence of significant mucosal edema and NSD/ITH -Will initiate medical management with doxycycline and steroid pack dosage and frequency below -I instructed the patient to resume Astelin and start nasal saline rinses - Continue Claritin 10 mg daily - CT sinuses - and RTC after imaging    Dysphonia raspy voice and frequent throat clearing/dry cough likely multifactorial, with GERD LPR and postnasal drainage contributing to the symptoms -No evidence of lesions or masses on the vocal folds today during video stroboscopy she had signs of vocal fold atrophy with mild glottic insufficiency as well as moderate to severe postcricoid edema pachydermia consistent with GERD LPR - continue Prilosec 20 mg daily for now, trial of Reflux Gourmet  -Management of postnasal drainage  - 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 Please do not hesitate to contact me with any questions or concerns.   Ashok Croon, MD Otolaryngology [facility] Phone: [phone] Fax: [phone]    01/17/2023, 5:24 PM

## 2023-01-17 NOTE — Patient Instructions (Signed)
-   Medrol Pack (steroid) and Doxycycline (antibiotic) for sinus infection - CT Sinuses - continue Prilosec, start Reflux Gourmet - resume Azelastine (Astepro) - return after testing  Lloyd Huger Med Nasal Saline Rinse   - start nasal saline rinses with NeilMed Bottle available over the counter or online to help with nasal congestion     - Take Reflux Gourmet (natural supplement available on Amazon) to help with symptoms of chronic throat irritation

## 2023-03-15 DIAGNOSIS — S63232D Subluxation of proximal interphalangeal joint of right middle finger, subsequent encounter: Secondary | ICD-10-CM | POA: Diagnosis not present

## 2023-03-15 DIAGNOSIS — S63282A Dislocation of proximal interphalangeal joint of right middle finger, initial encounter: Secondary | ICD-10-CM | POA: Diagnosis not present

## 2023-03-15 DIAGNOSIS — S6991XA Unspecified injury of right wrist, hand and finger(s), initial encounter: Secondary | ICD-10-CM | POA: Diagnosis not present

## 2023-03-15 DIAGNOSIS — M7989 Other specified soft tissue disorders: Secondary | ICD-10-CM | POA: Diagnosis not present

## 2023-03-15 DIAGNOSIS — S63252A Unspecified dislocation of right middle finger, initial encounter: Secondary | ICD-10-CM | POA: Diagnosis not present

## 2023-03-16 DIAGNOSIS — S63414A Traumatic rupture of collateral ligament of right ring finger at metacarpophalangeal and interphalangeal joint, initial encounter: Secondary | ICD-10-CM | POA: Diagnosis not present

## 2023-03-16 DIAGNOSIS — X58XXXA Exposure to other specified factors, initial encounter: Secondary | ICD-10-CM | POA: Diagnosis not present

## 2023-03-16 DIAGNOSIS — S63282A Dislocation of proximal interphalangeal joint of right middle finger, initial encounter: Secondary | ICD-10-CM | POA: Diagnosis not present

## 2023-03-17 DIAGNOSIS — S63414A Traumatic rupture of collateral ligament of right ring finger at metacarpophalangeal and interphalangeal joint, initial encounter: Secondary | ICD-10-CM | POA: Diagnosis not present

## 2023-03-17 DIAGNOSIS — E785 Hyperlipidemia, unspecified: Secondary | ICD-10-CM | POA: Diagnosis not present

## 2023-03-17 DIAGNOSIS — Z9889 Other specified postprocedural states: Secondary | ICD-10-CM | POA: Diagnosis not present

## 2023-03-24 DIAGNOSIS — Z4789 Encounter for other orthopedic aftercare: Secondary | ICD-10-CM | POA: Diagnosis not present

## 2023-03-24 DIAGNOSIS — M25641 Stiffness of right hand, not elsewhere classified: Secondary | ICD-10-CM | POA: Diagnosis not present

## 2023-03-24 DIAGNOSIS — M79641 Pain in right hand: Secondary | ICD-10-CM | POA: Diagnosis not present

## 2023-04-11 DIAGNOSIS — C44319 Basal cell carcinoma of skin of other parts of face: Secondary | ICD-10-CM | POA: Diagnosis not present

## 2023-04-12 DIAGNOSIS — H52223 Regular astigmatism, bilateral: Secondary | ICD-10-CM | POA: Diagnosis not present

## 2023-04-12 DIAGNOSIS — H43813 Vitreous degeneration, bilateral: Secondary | ICD-10-CM | POA: Diagnosis not present

## 2023-04-12 DIAGNOSIS — H5203 Hypermetropia, bilateral: Secondary | ICD-10-CM | POA: Diagnosis not present

## 2023-04-12 DIAGNOSIS — H524 Presbyopia: Secondary | ICD-10-CM | POA: Diagnosis not present

## 2023-04-12 DIAGNOSIS — H04123 Dry eye syndrome of bilateral lacrimal glands: Secondary | ICD-10-CM | POA: Diagnosis not present

## 2023-04-12 DIAGNOSIS — H2513 Age-related nuclear cataract, bilateral: Secondary | ICD-10-CM | POA: Diagnosis not present

## 2023-04-21 ENCOUNTER — Ambulatory Visit (HOSPITAL_COMMUNITY): Payer: Medicare Other

## 2023-04-22 DIAGNOSIS — Z4889 Encounter for other specified surgical aftercare: Secondary | ICD-10-CM | POA: Diagnosis not present

## 2023-04-22 DIAGNOSIS — M25641 Stiffness of right hand, not elsewhere classified: Secondary | ICD-10-CM | POA: Diagnosis not present

## 2023-04-26 DIAGNOSIS — M25641 Stiffness of right hand, not elsewhere classified: Secondary | ICD-10-CM | POA: Diagnosis not present

## 2023-04-26 DIAGNOSIS — Z4889 Encounter for other specified surgical aftercare: Secondary | ICD-10-CM | POA: Diagnosis not present

## 2023-04-28 ENCOUNTER — Other Ambulatory Visit: Payer: Medicare Other

## 2023-05-02 DIAGNOSIS — M25641 Stiffness of right hand, not elsewhere classified: Secondary | ICD-10-CM | POA: Diagnosis not present

## 2023-05-02 DIAGNOSIS — Z4889 Encounter for other specified surgical aftercare: Secondary | ICD-10-CM | POA: Diagnosis not present

## 2023-05-03 ENCOUNTER — Ambulatory Visit
Admission: RE | Admit: 2023-05-03 | Discharge: 2023-05-03 | Disposition: A | Discharge status: Home / Self Care | Payer: Medicare Other | Source: Ambulatory Visit | Attending: Otolaryngology

## 2023-05-03 DIAGNOSIS — J3489 Other specified disorders of nose and nasal sinuses: Secondary | ICD-10-CM | POA: Diagnosis not present

## 2023-05-03 DIAGNOSIS — J329 Chronic sinusitis, unspecified: Secondary | ICD-10-CM

## 2023-05-11 DIAGNOSIS — Z4889 Encounter for other specified surgical aftercare: Secondary | ICD-10-CM | POA: Diagnosis not present

## 2023-05-11 DIAGNOSIS — M25641 Stiffness of right hand, not elsewhere classified: Secondary | ICD-10-CM | POA: Diagnosis not present

## 2023-05-18 DIAGNOSIS — Z4889 Encounter for other specified surgical aftercare: Secondary | ICD-10-CM | POA: Diagnosis not present

## 2023-05-18 DIAGNOSIS — M25641 Stiffness of right hand, not elsewhere classified: Secondary | ICD-10-CM | POA: Diagnosis not present

## 2023-05-20 ENCOUNTER — Ambulatory Visit (INDEPENDENT_AMBULATORY_CARE_PROVIDER_SITE_OTHER): Payer: Medicare Other | Admitting: Otolaryngology

## 2023-05-22 ENCOUNTER — Other Ambulatory Visit (INDEPENDENT_AMBULATORY_CARE_PROVIDER_SITE_OTHER): Payer: Self-pay | Admitting: Otolaryngology

## 2023-05-22 DIAGNOSIS — R0981 Nasal congestion: Secondary | ICD-10-CM

## 2023-05-22 NOTE — Progress Notes (Signed)
Brain MRI (without contrast should suffice, but recommend requesting thin coronal T2 images) to exclude the presence of Encephalocele.

## 2023-05-25 DIAGNOSIS — M25641 Stiffness of right hand, not elsewhere classified: Secondary | ICD-10-CM | POA: Diagnosis not present

## 2023-05-25 DIAGNOSIS — Z4889 Encounter for other specified surgical aftercare: Secondary | ICD-10-CM | POA: Diagnosis not present

## 2023-05-26 ENCOUNTER — Ambulatory Visit (HOSPITAL_COMMUNITY)
Admission: RE | Admit: 2023-05-26 | Discharge: 2023-05-26 | Disposition: A | Discharge status: Home / Self Care | Payer: Medicare Other | Source: Ambulatory Visit | Attending: Otolaryngology | Admitting: Otolaryngology

## 2023-05-26 DIAGNOSIS — R0981 Nasal congestion: Secondary | ICD-10-CM | POA: Insufficient documentation

## 2023-05-30 ENCOUNTER — Ambulatory Visit (INDEPENDENT_AMBULATORY_CARE_PROVIDER_SITE_OTHER): Payer: Medicare Other | Admitting: Otolaryngology

## 2023-05-30 ENCOUNTER — Encounter (INDEPENDENT_AMBULATORY_CARE_PROVIDER_SITE_OTHER): Payer: Self-pay

## 2023-05-30 ENCOUNTER — Other Ambulatory Visit (INDEPENDENT_AMBULATORY_CARE_PROVIDER_SITE_OTHER): Payer: Self-pay | Admitting: Otolaryngology

## 2023-05-30 DIAGNOSIS — R9089 Other abnormal findings on diagnostic imaging of central nervous system: Secondary | ICD-10-CM

## 2023-05-30 NOTE — Progress Notes (Signed)
ENT Progress Note   Patient seen for dysphonia and chronic nasal congestion back in September 2024.  CT sinuses with opacification of bilateral olfactory recess with recommendation to obtain brain MRI per radiology to rule out encephalocele.  MRI brain without contrast with evidence of soft tissue along the olfactory recess with subsequent recommendation to obtain MRI face to rule out SNUC.   I discussed imaging results with the patient by phone.  Explained the findings and answered all of her questions.  Order placed for MRI face with contrast to better evaluate.  An order for external ENT referral to rhinology at [facility] was also placed.  Patient was instructed to schedule MRI face with contrast and schedule an appointment to see rhinology specialist either Dr. Hollace Hayward or Dr. Betsey Amen.  She will return to see me for dysphonia in the future. At this time she reports her dysphonia symptoms significantly improved and we will go ahead and cancel her appointment with me for now.  She will call to schedule a follow-up appointment in the future if needed.    Imaging reviewed   CT sinuses 05/14/23 IMPRESSION: 1. Opacified bilateral olfactory recesses, which is unusual even in the setting of rhinosinusitis. Recommend Brain MRI (without contrast should suffice, but recommend requesting thin coronal T2 images) to exclude the presence of Encephalocele.   2. Scattered bilateral paranasal sinus mucosal thickening and opacification. Scattered fluid/bubbly opacity in keeping with a degree of active sinusitis. Some chronic sinusitis changes mostly limited to the left maxillary (periosteal thickening). The left frontal sinus is spared.   MRI brain without contrast 05/26/23 IMPRESSION: 1. No encephalocele is seen; however, there is soft tissue in the olfactory recess, as noted on the prior CT, which is nonspecific but could represent a sinonasal undifferentiated carcinoma (SNUC) or other sinus tumor. MRI  without and with contrast with skull base protocol or MRI face is recommended. 2. No acute intracranial process.

## 2023-06-01 DIAGNOSIS — Z4889 Encounter for other specified surgical aftercare: Secondary | ICD-10-CM | POA: Diagnosis not present

## 2023-06-01 DIAGNOSIS — M25641 Stiffness of right hand, not elsewhere classified: Secondary | ICD-10-CM | POA: Diagnosis not present

## 2023-06-04 ENCOUNTER — Ambulatory Visit (HOSPITAL_COMMUNITY)
Admission: RE | Admit: 2023-06-04 | Discharge: 2023-06-04 | Disposition: A | Discharge status: Home / Self Care | Payer: Medicare Other | Source: Ambulatory Visit | Attending: Otolaryngology | Admitting: Otolaryngology

## 2023-06-04 DIAGNOSIS — R9089 Other abnormal findings on diagnostic imaging of central nervous system: Secondary | ICD-10-CM | POA: Diagnosis not present

## 2023-06-04 DIAGNOSIS — Z1289 Encounter for screening for malignant neoplasm of other sites: Secondary | ICD-10-CM | POA: Insufficient documentation

## 2023-06-04 DIAGNOSIS — J3489 Other specified disorders of nose and nasal sinuses: Secondary | ICD-10-CM | POA: Diagnosis not present

## 2023-06-04 DIAGNOSIS — R9402 Abnormal brain scan: Secondary | ICD-10-CM | POA: Diagnosis not present

## 2023-06-04 MED ORDER — GADOBUTROL 1 MMOL/ML IV SOLN
7.0000 mL | Freq: Once | INTRAVENOUS | Status: AC | PRN
  Administered 2023-06-04: 7 mL via INTRAVENOUS

## 2023-06-06 DIAGNOSIS — S6991XA Unspecified injury of right wrist, hand and finger(s), initial encounter: Secondary | ICD-10-CM | POA: Diagnosis not present

## 2023-06-06 DIAGNOSIS — S63289A Dislocation of proximal interphalangeal joint of unspecified finger, initial encounter: Secondary | ICD-10-CM | POA: Diagnosis not present

## 2023-06-06 DIAGNOSIS — M79641 Pain in right hand: Secondary | ICD-10-CM | POA: Diagnosis not present

## 2023-06-15 DIAGNOSIS — M25641 Stiffness of right hand, not elsewhere classified: Secondary | ICD-10-CM | POA: Diagnosis not present

## 2023-06-15 DIAGNOSIS — Z4889 Encounter for other specified surgical aftercare: Secondary | ICD-10-CM | POA: Diagnosis not present

## 2023-06-22 DIAGNOSIS — M25641 Stiffness of right hand, not elsewhere classified: Secondary | ICD-10-CM | POA: Diagnosis not present

## 2023-06-22 DIAGNOSIS — Z4889 Encounter for other specified surgical aftercare: Secondary | ICD-10-CM | POA: Diagnosis not present

## 2023-06-24 ENCOUNTER — Other Ambulatory Visit: Payer: Self-pay | Admitting: Family Medicine

## 2023-06-24 DIAGNOSIS — Z0000 Encounter for general adult medical examination without abnormal findings: Secondary | ICD-10-CM | POA: Diagnosis not present

## 2023-06-24 DIAGNOSIS — Z1231 Encounter for screening mammogram for malignant neoplasm of breast: Secondary | ICD-10-CM

## 2023-06-24 DIAGNOSIS — Z1331 Encounter for screening for depression: Secondary | ICD-10-CM | POA: Diagnosis not present

## 2023-06-24 DIAGNOSIS — E2839 Other primary ovarian failure: Secondary | ICD-10-CM

## 2023-06-24 DIAGNOSIS — Z131 Encounter for screening for diabetes mellitus: Secondary | ICD-10-CM | POA: Diagnosis not present

## 2023-06-24 DIAGNOSIS — N959 Unspecified menopausal and perimenopausal disorder: Secondary | ICD-10-CM | POA: Diagnosis not present

## 2023-06-24 DIAGNOSIS — E782 Mixed hyperlipidemia: Secondary | ICD-10-CM | POA: Diagnosis not present

## 2023-06-29 DIAGNOSIS — M25641 Stiffness of right hand, not elsewhere classified: Secondary | ICD-10-CM | POA: Diagnosis not present

## 2023-07-11 DIAGNOSIS — Z4889 Encounter for other specified surgical aftercare: Secondary | ICD-10-CM | POA: Diagnosis not present

## 2023-07-11 DIAGNOSIS — M25641 Stiffness of right hand, not elsewhere classified: Secondary | ICD-10-CM | POA: Diagnosis not present

## 2023-07-13 ENCOUNTER — Ambulatory Visit
Admission: RE | Admit: 2023-07-13 | Discharge: 2023-07-13 | Disposition: A | Discharge status: Home / Self Care | Payer: Medicare Other | Source: Ambulatory Visit | Attending: Family Medicine | Admitting: Family Medicine

## 2023-07-13 DIAGNOSIS — L821 Other seborrheic keratosis: Secondary | ICD-10-CM | POA: Diagnosis not present

## 2023-07-13 DIAGNOSIS — L219 Seborrheic dermatitis, unspecified: Secondary | ICD-10-CM | POA: Diagnosis not present

## 2023-07-13 DIAGNOSIS — Z1231 Encounter for screening mammogram for malignant neoplasm of breast: Secondary | ICD-10-CM | POA: Diagnosis not present

## 2023-07-20 DIAGNOSIS — M25641 Stiffness of right hand, not elsewhere classified: Secondary | ICD-10-CM | POA: Diagnosis not present

## 2023-07-20 DIAGNOSIS — Z4889 Encounter for other specified surgical aftercare: Secondary | ICD-10-CM | POA: Diagnosis not present

## 2023-07-25 DIAGNOSIS — T63481A Toxic effect of venom of other arthropod, accidental (unintentional), initial encounter: Secondary | ICD-10-CM | POA: Diagnosis not present

## 2023-07-26 DIAGNOSIS — J328 Other chronic sinusitis: Secondary | ICD-10-CM | POA: Diagnosis not present

## 2023-07-26 DIAGNOSIS — J342 Deviated nasal septum: Secondary | ICD-10-CM | POA: Diagnosis not present

## 2023-07-26 DIAGNOSIS — J338 Other polyp of sinus: Secondary | ICD-10-CM | POA: Diagnosis not present

## 2023-07-26 DIAGNOSIS — R439 Unspecified disturbances of smell and taste: Secondary | ICD-10-CM | POA: Diagnosis not present

## 2023-07-26 DIAGNOSIS — R0989 Other specified symptoms and signs involving the circulatory and respiratory systems: Secondary | ICD-10-CM | POA: Diagnosis not present

## 2023-07-26 DIAGNOSIS — J343 Hypertrophy of nasal turbinates: Secondary | ICD-10-CM | POA: Diagnosis not present

## 2023-07-26 DIAGNOSIS — R430 Anosmia: Secondary | ICD-10-CM | POA: Diagnosis not present

## 2023-07-26 DIAGNOSIS — J3489 Other specified disorders of nose and nasal sinuses: Secondary | ICD-10-CM | POA: Diagnosis not present

## 2023-07-27 DIAGNOSIS — Z4889 Encounter for other specified surgical aftercare: Secondary | ICD-10-CM | POA: Diagnosis not present

## 2023-07-27 DIAGNOSIS — M25641 Stiffness of right hand, not elsewhere classified: Secondary | ICD-10-CM | POA: Diagnosis not present

## 2023-08-01 DIAGNOSIS — S63289A Dislocation of proximal interphalangeal joint of unspecified finger, initial encounter: Secondary | ICD-10-CM | POA: Diagnosis not present

## 2023-08-01 DIAGNOSIS — S6991XA Unspecified injury of right wrist, hand and finger(s), initial encounter: Secondary | ICD-10-CM | POA: Diagnosis not present

## 2023-08-23 DIAGNOSIS — Z4789 Encounter for other orthopedic aftercare: Secondary | ICD-10-CM | POA: Diagnosis not present

## 2023-08-23 DIAGNOSIS — S63289A Dislocation of proximal interphalangeal joint of unspecified finger, initial encounter: Secondary | ICD-10-CM | POA: Diagnosis not present

## 2023-08-23 DIAGNOSIS — M21839 Other specified acquired deformities of unspecified forearm: Secondary | ICD-10-CM | POA: Diagnosis not present

## 2023-08-30 DIAGNOSIS — S63289A Dislocation of proximal interphalangeal joint of unspecified finger, initial encounter: Secondary | ICD-10-CM | POA: Diagnosis not present

## 2023-10-13 DIAGNOSIS — L821 Other seborrheic keratosis: Secondary | ICD-10-CM | POA: Diagnosis not present

## 2023-10-13 DIAGNOSIS — L810 Postinflammatory hyperpigmentation: Secondary | ICD-10-CM | POA: Diagnosis not present

## 2023-10-13 DIAGNOSIS — L570 Actinic keratosis: Secondary | ICD-10-CM | POA: Diagnosis not present

## 2023-10-13 DIAGNOSIS — L820 Inflamed seborrheic keratosis: Secondary | ICD-10-CM | POA: Diagnosis not present

## 2023-10-13 DIAGNOSIS — D485 Neoplasm of uncertain behavior of skin: Secondary | ICD-10-CM | POA: Diagnosis not present

## 2023-10-13 DIAGNOSIS — L814 Other melanin hyperpigmentation: Secondary | ICD-10-CM | POA: Diagnosis not present

## 2023-10-13 DIAGNOSIS — D229 Melanocytic nevi, unspecified: Secondary | ICD-10-CM | POA: Diagnosis not present

## 2023-10-31 DIAGNOSIS — E7800 Pure hypercholesterolemia, unspecified: Secondary | ICD-10-CM | POA: Diagnosis not present

## 2023-10-31 DIAGNOSIS — M25551 Pain in right hip: Secondary | ICD-10-CM | POA: Diagnosis not present

## 2023-10-31 DIAGNOSIS — E782 Mixed hyperlipidemia: Secondary | ICD-10-CM | POA: Diagnosis not present

## 2023-12-01 DIAGNOSIS — E782 Mixed hyperlipidemia: Secondary | ICD-10-CM | POA: Diagnosis not present

## 2023-12-01 DIAGNOSIS — E7800 Pure hypercholesterolemia, unspecified: Secondary | ICD-10-CM | POA: Diagnosis not present

## 2024-01-01 DIAGNOSIS — E7800 Pure hypercholesterolemia, unspecified: Secondary | ICD-10-CM | POA: Diagnosis not present

## 2024-01-01 DIAGNOSIS — E782 Mixed hyperlipidemia: Secondary | ICD-10-CM | POA: Diagnosis not present

## 2024-01-24 DIAGNOSIS — J328 Other chronic sinusitis: Secondary | ICD-10-CM | POA: Diagnosis not present

## 2024-01-24 DIAGNOSIS — J343 Hypertrophy of nasal turbinates: Secondary | ICD-10-CM | POA: Diagnosis not present

## 2024-01-24 DIAGNOSIS — J342 Deviated nasal septum: Secondary | ICD-10-CM | POA: Diagnosis not present

## 2024-01-24 DIAGNOSIS — J3489 Other specified disorders of nose and nasal sinuses: Secondary | ICD-10-CM | POA: Diagnosis not present

## 2024-01-24 DIAGNOSIS — R430 Anosmia: Secondary | ICD-10-CM | POA: Diagnosis not present

## 2024-01-31 DIAGNOSIS — E782 Mixed hyperlipidemia: Secondary | ICD-10-CM | POA: Diagnosis not present

## 2024-01-31 DIAGNOSIS — E7800 Pure hypercholesterolemia, unspecified: Secondary | ICD-10-CM | POA: Diagnosis not present

## 2024-02-20 ENCOUNTER — Other Ambulatory Visit: Payer: Medicare Other

## 2024-02-21 ENCOUNTER — Ambulatory Visit (HOSPITAL_BASED_OUTPATIENT_CLINIC_OR_DEPARTMENT_OTHER)
Admission: RE | Admit: 2024-02-21 | Discharge: 2024-02-21 | Disposition: A | Discharge status: Home / Self Care | Source: Ambulatory Visit | Attending: Family Medicine | Admitting: Family Medicine

## 2024-02-21 DIAGNOSIS — M810 Age-related osteoporosis without current pathological fracture: Secondary | ICD-10-CM | POA: Diagnosis not present

## 2024-02-21 DIAGNOSIS — Z780 Asymptomatic menopausal state: Secondary | ICD-10-CM | POA: Diagnosis not present

## 2024-02-21 DIAGNOSIS — E2839 Other primary ovarian failure: Secondary | ICD-10-CM | POA: Insufficient documentation

## 2024-03-02 DIAGNOSIS — E7800 Pure hypercholesterolemia, unspecified: Secondary | ICD-10-CM | POA: Diagnosis not present

## 2024-03-02 DIAGNOSIS — E782 Mixed hyperlipidemia: Secondary | ICD-10-CM | POA: Diagnosis not present

## 2024-03-09 DIAGNOSIS — M810 Age-related osteoporosis without current pathological fracture: Secondary | ICD-10-CM | POA: Diagnosis not present

## 2024-03-23 DIAGNOSIS — M810 Age-related osteoporosis without current pathological fracture: Secondary | ICD-10-CM | POA: Diagnosis not present

## 2024-04-01 DIAGNOSIS — E7800 Pure hypercholesterolemia, unspecified: Secondary | ICD-10-CM | POA: Diagnosis not present

## 2024-04-01 DIAGNOSIS — E782 Mixed hyperlipidemia: Secondary | ICD-10-CM | POA: Diagnosis not present
